# Patient Record
Sex: Female | Born: 1970 | Race: Black or African American | Hispanic: No | Marital: Single | Smoking: Current every day smoker
Health system: Southern US, Community
[De-identification: ages and names within clinical notes are randomized; demographics above are authoritative.]

---

## 2018-01-26 ENCOUNTER — Other Ambulatory Visit: Payer: Self-pay

## 2018-01-26 ENCOUNTER — Encounter (HOSPITAL_BASED_OUTPATIENT_CLINIC_OR_DEPARTMENT_OTHER): Payer: Self-pay | Admitting: *Deleted

## 2018-01-26 ENCOUNTER — Emergency Department (HOSPITAL_BASED_OUTPATIENT_CLINIC_OR_DEPARTMENT_OTHER)
Admission: EM | Admit: 2018-01-26 | Discharge: 2018-01-26 | Disposition: A | Discharge status: Home / Self Care | Payer: No Typology Code available for payment source | Attending: Emergency Medicine | Admitting: Emergency Medicine

## 2018-01-26 DIAGNOSIS — M545 Low back pain, unspecified: Secondary | ICD-10-CM

## 2018-01-26 DIAGNOSIS — M542 Cervicalgia: Secondary | ICD-10-CM | POA: Insufficient documentation

## 2018-01-26 MED ORDER — CYCLOBENZAPRINE HCL 10 MG PO TABS: 10.0000 mg | ORAL_TABLET | Freq: Two times a day (BID) | ORAL | 0 refills | Status: DC | PRN

## 2018-01-26 MED ORDER — IBUPROFEN 800 MG PO TABS
800.0000 mg | ORAL_TABLET | Freq: Once | ORAL | Status: AC
  Administered 2018-01-26: 800 mg via ORAL
  Filled 2018-01-26: qty 1

## 2018-01-26 MED ORDER — CYCLOBENZAPRINE HCL 5 MG PO TABS
5.0000 mg | ORAL_TABLET | Freq: Once | ORAL | Status: AC
  Administered 2018-01-26: 5 mg via ORAL
  Filled 2018-01-26: qty 1

## 2018-01-26 NOTE — Discharge Instructions (Addendum)
Your physical exam checks out today. I have no concerns that you have any broken bones. ° °I have prescribed you a muscle relaxer, Flexeril, for muscle spasms. You may also use Tylenol and/or Ibuprofen (or Naproxen) for pain relief. You may also use warm or cold compresses. Soreness should begin to feel better over the next couple of weeks. Follow-up with your PCP if you continue to have issues for more than 4-6 weeks. ° °
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
## 2020-04-11 ENCOUNTER — Emergency Department (HOSPITAL_BASED_OUTPATIENT_CLINIC_OR_DEPARTMENT_OTHER): Payer: Self-pay

## 2020-04-11 ENCOUNTER — Encounter (HOSPITAL_BASED_OUTPATIENT_CLINIC_OR_DEPARTMENT_OTHER): Payer: Self-pay | Admitting: *Deleted

## 2020-04-11 ENCOUNTER — Other Ambulatory Visit: Payer: Self-pay

## 2020-04-11 ENCOUNTER — Emergency Department (HOSPITAL_BASED_OUTPATIENT_CLINIC_OR_DEPARTMENT_OTHER)
Admission: EM | Admit: 2020-04-11 | Discharge: 2020-04-11 | Disposition: A | Discharge status: Home / Self Care | Payer: Self-pay | Attending: Emergency Medicine | Admitting: Emergency Medicine

## 2020-04-11 DIAGNOSIS — F17290 Nicotine dependence, other tobacco product, uncomplicated: Secondary | ICD-10-CM | POA: Insufficient documentation

## 2020-04-11 DIAGNOSIS — M25522 Pain in left elbow: Secondary | ICD-10-CM | POA: Insufficient documentation

## 2020-04-11 DIAGNOSIS — R030 Elevated blood-pressure reading, without diagnosis of hypertension: Secondary | ICD-10-CM

## 2020-04-11 DIAGNOSIS — M25512 Pain in left shoulder: Secondary | ICD-10-CM | POA: Insufficient documentation

## 2020-04-11 MED ORDER — LIDOCAINE HCL (PF) 1 % IJ SOLN
30.0000 mL | Freq: Once | INTRAMUSCULAR | Status: AC
  Administered 2020-04-11: 10 mL
  Filled 2020-04-11: qty 30

## 2020-04-11 NOTE — Discharge Instructions (Signed)
Follow up regarding your blood pressure Return for any new or worsening symptoms.

## 2020-04-11 NOTE — ED Provider Notes (Signed)
[facility] EMERGENCY DEPARTMENT Provider Note   CSN: [id number] Arrival date & time: 04/11/20  1003     History Chief Complaint  Patient presents with  . Shoulder, neck, back pain    Miranda Jefferson is a 49 y.o. female with past medical history of obesity hypertension who presents emergency department chief complaint of left shoulder pain.  Patient states that the pain is located in between her shoulder blade that has been going on for about 5 days.  She states that it hurts to even lift her arm.  She feels like it radiates down to her elbow.  She denies neck pain, she has tried heat, Biofreeze, Tylenol without much relief of her symptoms.  Pain is worse with movement and lifting the arm.  She feels like sometimes her left shoulder joint "slips out of place."  HPI     History reviewed. No pertinent past medical history.  There are no problems to display for this patient.   Past Surgical History:  Procedure Laterality Date  . CESAREAN SECTION       OB History    Gravida  5   Para  4   Term      Preterm      AB  1   Living        SAB      TAB      Ectopic      Multiple      Live Births              Family History  Problem Relation Age of Onset  . Cancer Mother   . Cancer Father   . Aneurysm Sister     Social History   Tobacco Use  . Smoking status: Current Every Day Smoker    Packs/day: 2.00    Types: Cigars  . Smokeless tobacco: Never Used  Substance Use Topics  . Alcohol use: Yes    Comment: occasionally  . Drug use: Never    Home Medications Prior to Admission medications   Not on File    Allergies    Patient has no known allergies.  Review of Systems   Review of Systems Ten systems reviewed and are negative for acute change, except as noted in the HPI.   Physical Exam Updated Vital Signs BP (!) 170/115 (BP Location: Right Arm)   Pulse 88   Temp 98.3 F (36.8 C) (Oral)   Resp 16   Ht 5\' 6"  (1.676 m)   Wt  88.5 kg   LMP 04/08/2020   SpO2 100%   BMI 31.47 kg/m   Physical Exam Vitals and nursing note reviewed.  Constitutional:      General: She is not in acute distress.    Appearance: She is well-developed. She is not diaphoretic.  HENT:     Head: Normocephalic and atraumatic.  Eyes:     General: No scleral icterus.    Conjunctiva/sclera: Conjunctivae normal.  Cardiovascular:     Rate and Rhythm: Normal rate and regular rhythm.     Heart sounds: Normal heart sounds. No murmur heard.  No friction rub. No gallop.   Pulmonary:     Effort: Pulmonary effort is normal. No respiratory distress.     Breath sounds: Normal breath sounds.  Abdominal:     General: Bowel sounds are normal. There is no distension.     Palpations: Abdomen is soft. There is no mass.     Tenderness: There is no abdominal tenderness.  There is no guarding.  Musculoskeletal:     Cervical back: Normal range of motion.     Comments: Palpable trigger point with reproducible pain and tenderness along the left medial scapular border.  No tenderness to palpation of the left shoulder joint.  No pain with passive range of motion.  Patient reluctant to mobilize  Skin:    General: Skin is warm and dry.  Neurological:     Mental Status: She is alert and oriented to person, place, and time.  Psychiatric:        Behavior: Behavior normal.     ED Results / Procedures / Treatments   Labs (all labs ordered are listed, but only abnormal results are displayed) Labs Reviewed - No data to display  EKG None  Radiology DG Shoulder Left  Result Date: 04/11/2020 CLINICAL DATA:  Left shoulder pain for 5 days EXAM: LEFT SHOULDER - 2+ VIEW COMPARISON:  None. FINDINGS: There is no evidence of fracture or dislocation. There is no evidence of arthropathy or other focal bone abnormality. There is a 3.6 cm linear radiopaque structure which is visualized projecting within the soft tissues at the level of the mid left humeral diaphysis,  which may represent an implanted contraceptive device. IMPRESSION: 1. No acute osseous abnormality. 2. Incidentally noted 3.6 cm linear radiopaque structure which is visualized projecting within the soft tissues at the level of the mid left humeral diaphysis, which may represent an implanted contraceptive device. Electronically Signed   By: Duanne Guess D.O.   On: 04/11/2020 12:00    Procedures Procedures (including critical care time)  Trigger Point Injection Date/Time:04/11/2020 7:00 PM Performed by: Arthor Captain Authorized by: Arthor Captain Consent: Verbal consent obtained. Risks and benefits: risks, benefits and alternatives were discussed Consent given by: patient Patient identity confirmed: provided patient demograhics Preparation: Patient was prepped and draped in the usual sterile fashion. Local anesthesia used: yes Anesthesia: local infiltration Local anesthetic: 1% lidocaine  Anesthetic total: 62ml Patient tolerance: Patient tolerated the procedure well with no immediate complications    Medications Ordered in ED Medications  lidocaine (PF) (XYLOCAINE) 1 % injection 30 mL (10 mLs Infiltration Given 04/11/20 1250)    ED Course  I have reviewed the triage vital signs and the nursing notes.  Pertinent labs & imaging results that were available during my care of the patient were reviewed by me and considered in my medical decision making (see chart for details).    MDM Rules/Calculators/A&P                          Patient's x-ray is negative for acute fracture or dislocation. Patient given trigger point injection with near total resolution of her symptoms.  Discussed outpatient management and follow-up.  She was appropriate for discharge at this time. Final Clinical Impression(s) / ED Diagnoses Final diagnoses:  Trigger point of left shoulder region  Elevated blood pressure reading    Rx / DC Orders ED Discharge Orders    None       Arthor Captain,  PA-C 04/11/20 1902    Little, Ambrose Finland, MD 04/12/20 1515

## 2020-04-11 NOTE — ED Triage Notes (Signed)
Pain to left shoulder, neck and back for 5 days.  Denies injury.

## 2021-06-14 ENCOUNTER — Emergency Department (HOSPITAL_BASED_OUTPATIENT_CLINIC_OR_DEPARTMENT_OTHER)
Admission: EM | Admit: 2021-06-14 | Discharge: 2021-06-14 | Disposition: A | Discharge status: Home / Self Care | Payer: Self-pay | Attending: Emergency Medicine | Admitting: Emergency Medicine

## 2021-06-14 ENCOUNTER — Encounter (HOSPITAL_BASED_OUTPATIENT_CLINIC_OR_DEPARTMENT_OTHER): Payer: Self-pay

## 2021-06-14 DIAGNOSIS — F17210 Nicotine dependence, cigarettes, uncomplicated: Secondary | ICD-10-CM | POA: Insufficient documentation

## 2021-06-14 DIAGNOSIS — I10 Essential (primary) hypertension: Secondary | ICD-10-CM | POA: Insufficient documentation

## 2021-06-14 DIAGNOSIS — R040 Epistaxis: Secondary | ICD-10-CM | POA: Insufficient documentation

## 2021-06-14 MED ORDER — HYDROCHLOROTHIAZIDE 25 MG PO TABS: 25.0000 mg | ORAL_TABLET | Freq: Every day | ORAL | 1 refills | Status: AC

## 2021-06-14 NOTE — ED Provider Notes (Signed)
[facility] EMERGENCY DEPARTMENT Provider Note   CSN: [id number] Arrival date & time: 06/14/21  5366     History Chief Complaint  Patient presents with   Epistaxis    Miranda Jefferson is a 50 y.o. female.  She is here with an on-and-off nosebleed for the last few days.  Both nostrils.  Sometimes forming clots and going down her throat.  She was seen at [facility] few days ago for same.  Found to have elevated blood pressures.  Is working on getting a primary care doctor.  Had a nosebleed again today that has since resolved.  No pain fever cough shortness of breath.  No other bleeding identified.  She does admit to nose picking for the dry debris in her nose.  The history is provided by the patient.  Epistaxis Location:  Bilateral Severity:  Moderate Duration:  4 days Timing:  Intermittent Progression:  Unchanged Chronicity:  New Context: not anticoagulants   Relieved by:  Nothing Worsened by:  Nothing Ineffective treatments:  Applying pressure Associated symptoms: blood in oropharynx   Associated symptoms: no cough, no dizziness, no fever, no headaches, no sore throat and no syncope       History reviewed. No pertinent past medical history.  There are no problems to display for this patient.   Past Surgical History:  Procedure Laterality Date   CESAREAN SECTION       OB History     Gravida  5   Para  4   Term      Preterm      AB  1   Living         SAB      IAB      Ectopic      Multiple      Live Births              Family History  Problem Relation Age of Onset   Cancer Mother    Cancer Father    Aneurysm Sister     Social History   Tobacco Use   Smoking status: Every Day    Types: Cigars   Smokeless tobacco: Never  Substance Use Topics   Alcohol use: Not Currently    Comment: occasionally   Drug use: Never    Home Medications Prior to Admission medications   Not on File    Allergies    Patient has  no known allergies.  Review of Systems   Review of Systems  Constitutional:  Negative for fever.  HENT:  Positive for nosebleeds. Negative for sore throat.   Eyes:  Negative for visual disturbance.  Respiratory:  Negative for cough.   Cardiovascular:  Negative for chest pain and syncope.  Gastrointestinal:  Negative for abdominal pain.  Neurological:  Negative for dizziness and headaches.   Physical Exam Updated Vital Signs BP (!) 163/108    Pulse 76    Temp 98.3 F (36.8 C)    Resp 16    LMP 06/11/2021 (Approximate)    SpO2 97%   Physical Exam Constitutional:      Appearance: Normal appearance. She is well-developed.  HENT:     Head: Normocephalic and atraumatic.     Nose: Nose normal. No congestion or rhinorrhea.     Mouth/Throat:     Mouth: Mucous membranes are moist.     Pharynx: Oropharynx is clear.  Eyes:     Conjunctiva/sclera: Conjunctivae normal.  Cardiovascular:     Rate and  Rhythm: Normal rate and regular rhythm.  Pulmonary:     Effort: Pulmonary effort is normal.     Breath sounds: Normal breath sounds.  Musculoskeletal:     Cervical back: Neck supple.  Skin:    General: Skin is warm and dry.  Neurological:     General: No focal deficit present.     Mental Status: She is alert.     GCS: GCS eye subscore is 4. GCS verbal subscore is 5. GCS motor subscore is 6.    ED Results / Procedures / Treatments   Labs (all labs ordered are listed, but only abnormal results are displayed) Labs Reviewed - No data to display  EKG None  Radiology No results found.  Procedures Procedures   Medications Ordered in ED Medications - No data to display  ED Course  I have reviewed the triage vital signs and the nursing notes.  Pertinent labs & imaging results that were available during my care of the patient were reviewed by me and considered in my medical decision making (see chart for details).    MDM Rules/Calculators/A&P                         50 year old  complaint of epistaxis.  Not on blood thinners.  No bleeding currently.  No clear source to cauterize.  Patient also concerned about her elevated blood pressure.  She said she has a history of same about not on medication.  We will start her on some blood pressure medication.  No evidence of endorgan damage otherwise.  Recommended humidity and avoid picking her nose.  Return instructions discussed    Final Clinical Impression(s) / ED Diagnoses Final diagnoses:  Epistaxis  Primary hypertension    Rx / DC Orders ED Discharge Orders          Ordered    hydrochlorothiazide (HYDRODIURIL) 25 MG tablet  Daily        06/14/21 0819             Terrilee Files, MD 06/14/21 [phone]

## 2021-06-14 NOTE — ED Triage Notes (Signed)
Pt reports nose bleeding this morning due to hypertension. She went to hospital Tuesday for same complaint

## 2021-06-14 NOTE — Discharge Instructions (Signed)
You were seen in the emergency department for elevated blood pressure and nosebleeds.  There was no active nose bleeding and so no packing was placed.  Your blood pressure was elevated and this will need to be followed up with your primary care doctor.  We are starting you on some blood pressure medication.  You should also limit your salt intake.  You should try to use some humidity in your room.  Return if any worsening or concerning symptoms

## 2021-06-20 IMAGING — DX DG SHOULDER 2+V*L*
3 series · 3 of 3 positions shown · non-contrast
Comparison: None.

CLINICAL DATA: Left shoulder pain for 5 days

EXAM:
LEFT SHOULDER - 2+ VIEW

[shoulder grashey]
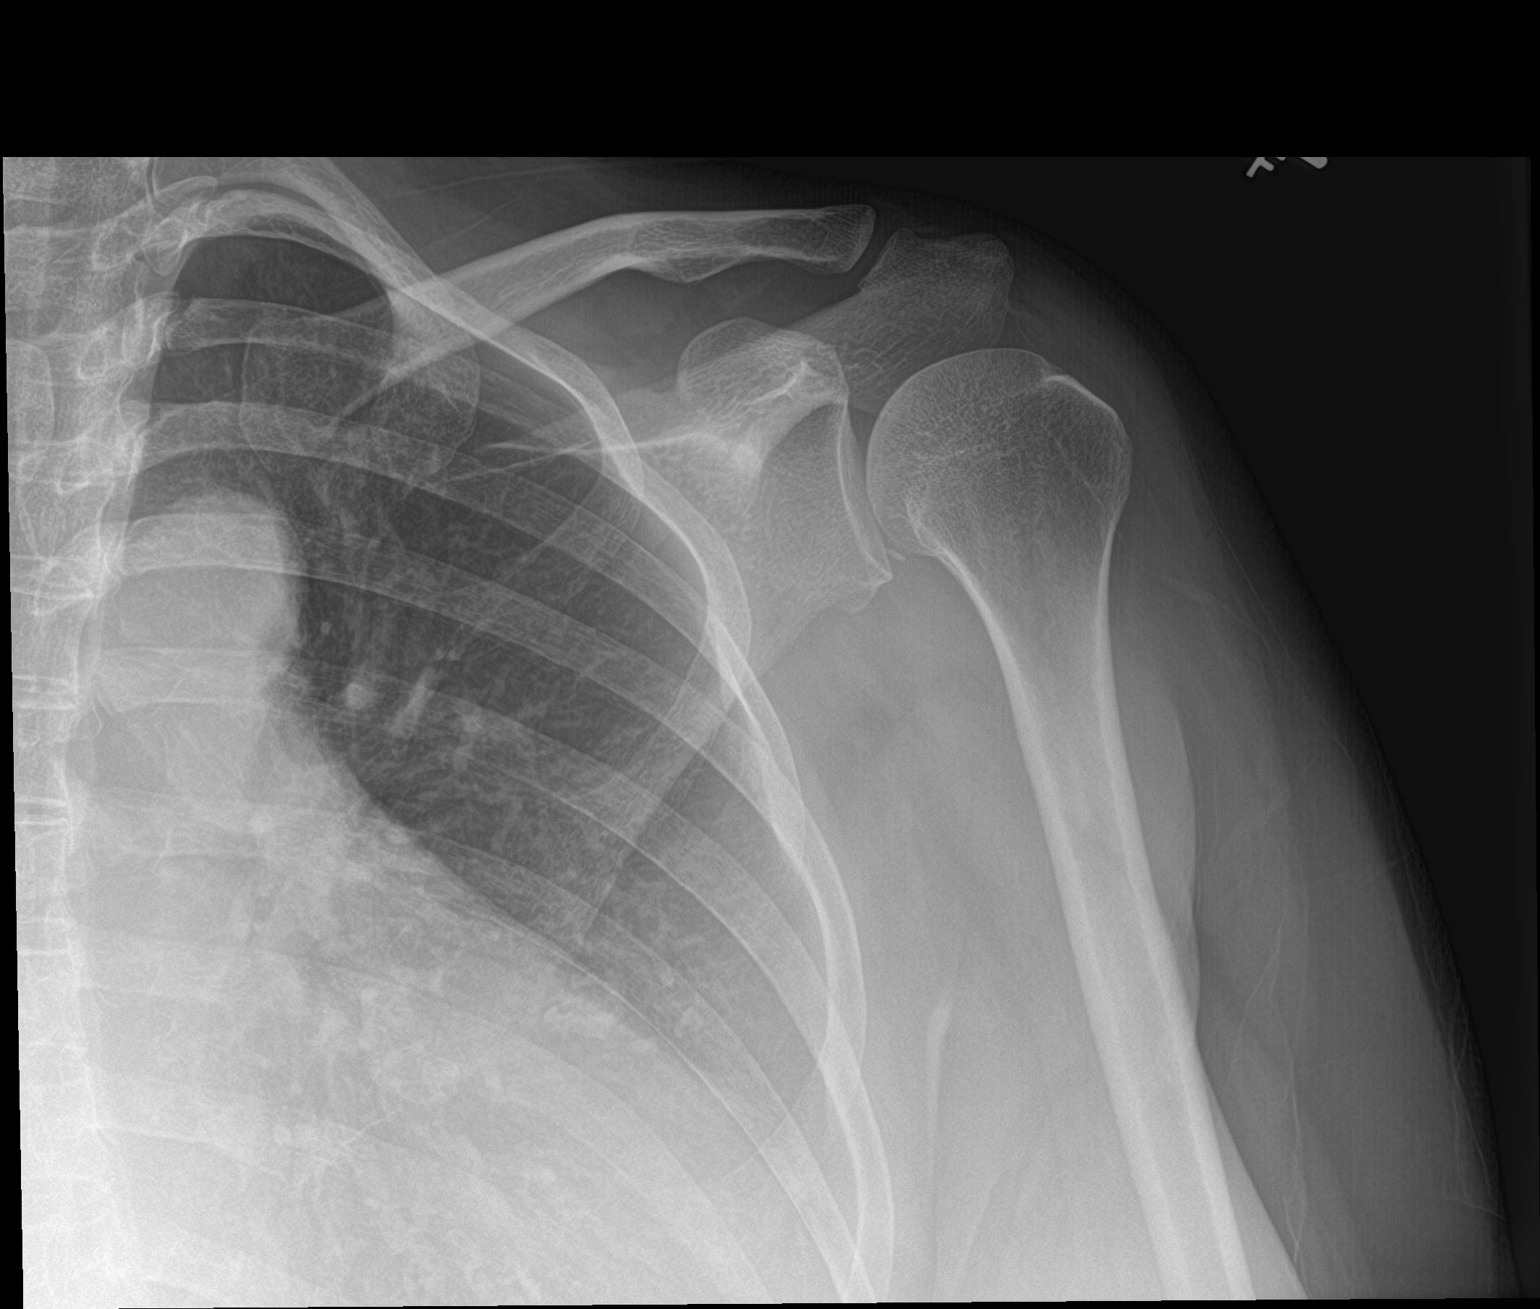

[shoulder y view]
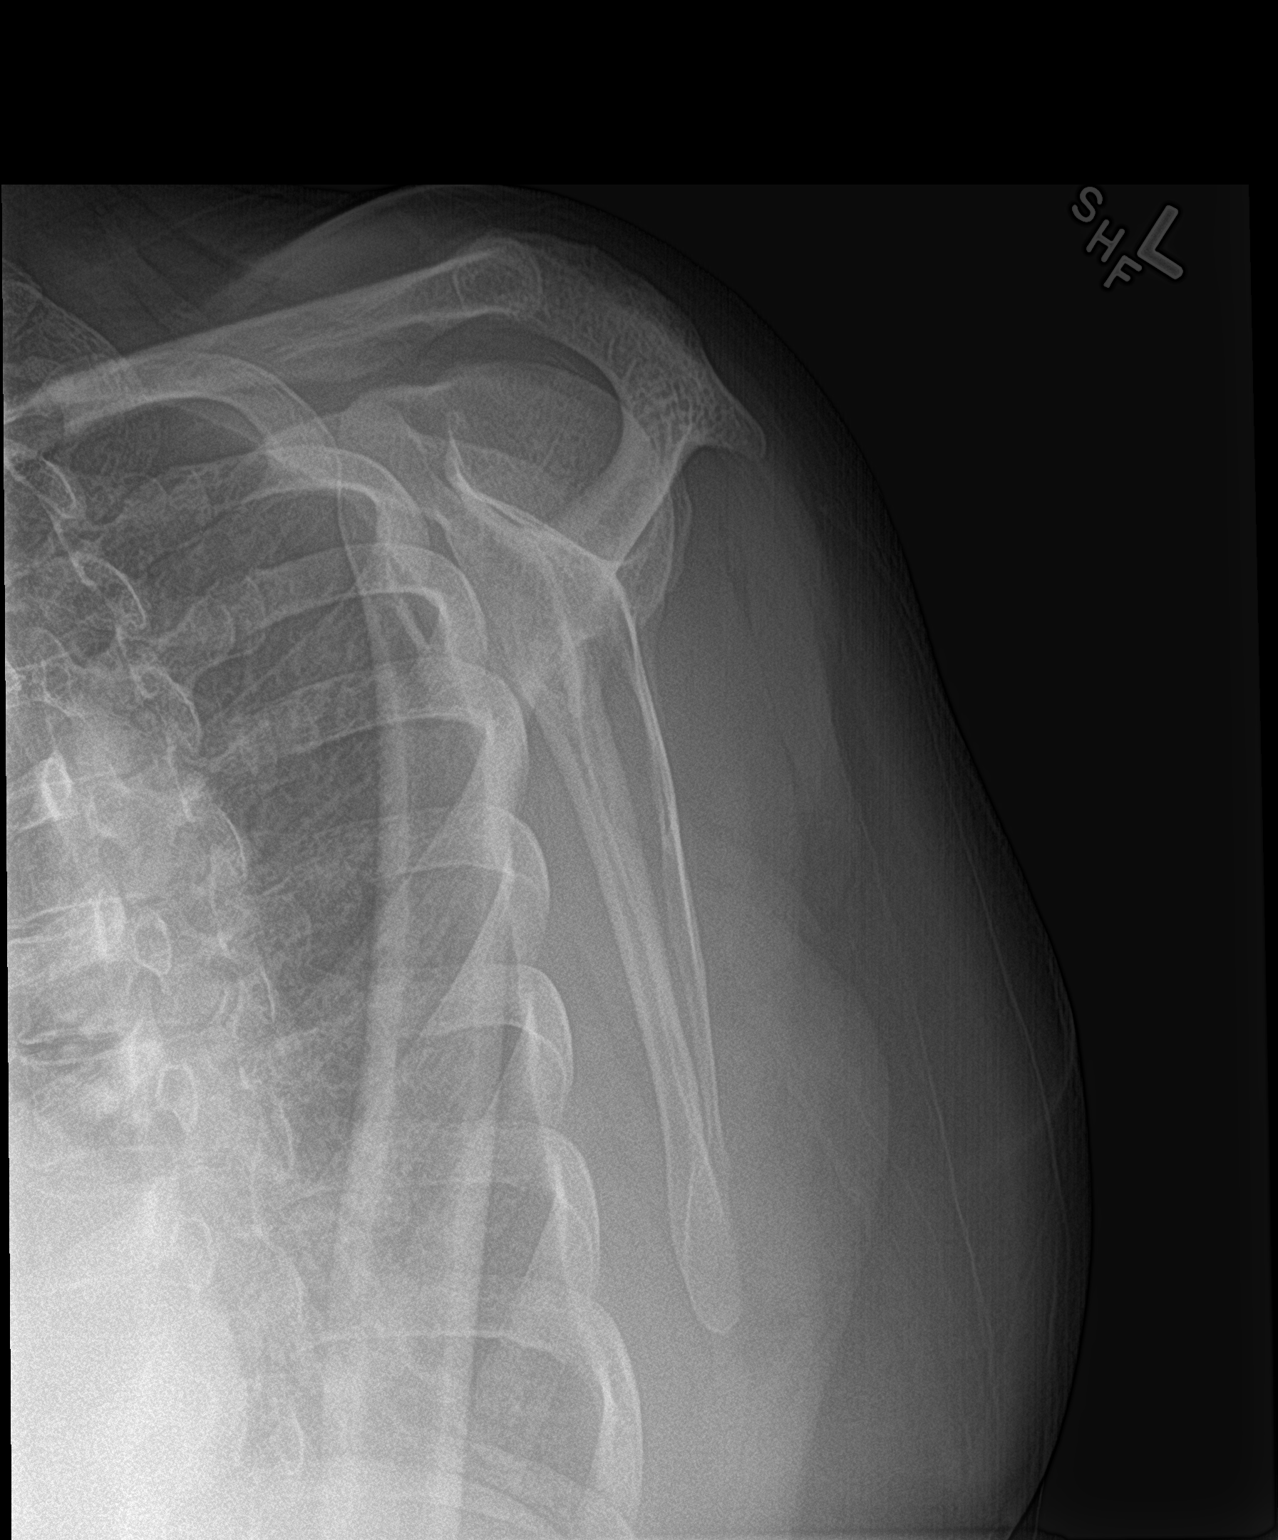

[shoulder axillary]
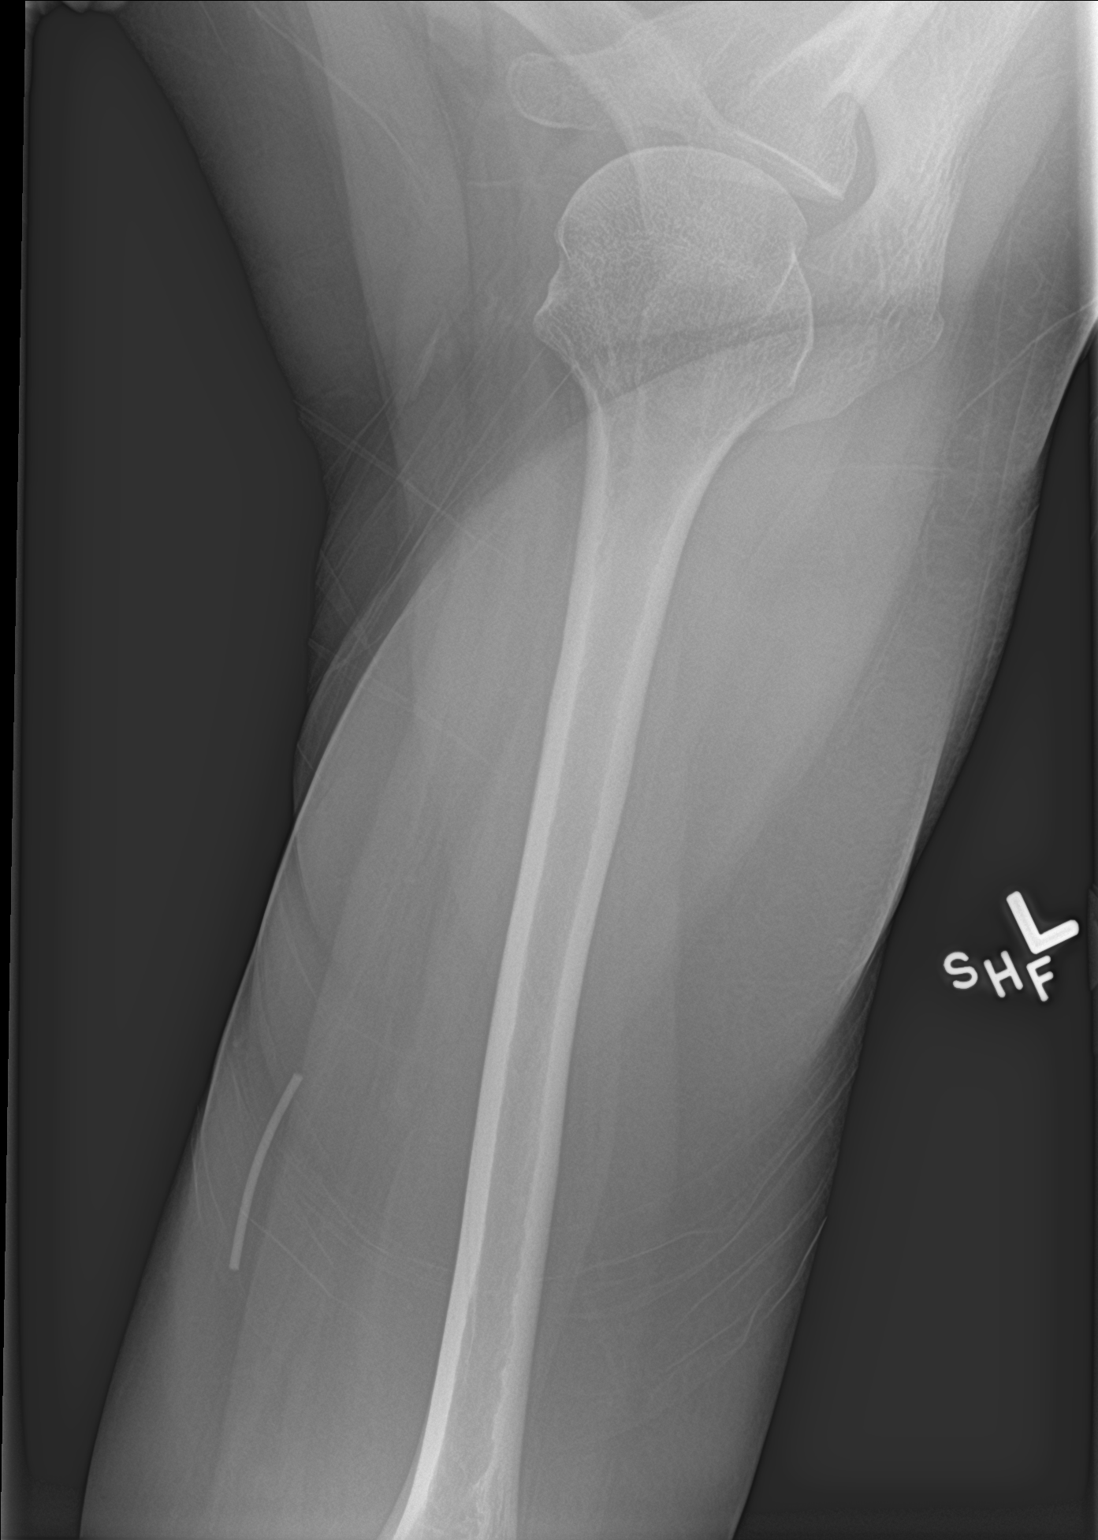

[3 of 3 positions shown; findings below may reference images not displayed]

FINDINGS: There is no evidence of fracture or dislocation. There is no
evidence of arthropathy or other focal bone abnormality. There is a
3.6 cm linear radiopaque structure which is visualized projecting
within the soft tissues at the level of the mid left humeral
diaphysis, which may represent an implanted contraceptive device.
IMPRESSION: 1. No acute osseous abnormality.
2. Incidentally noted 3.6 cm linear radiopaque structure which is
visualized projecting within the soft tissues at the level of the
mid left humeral diaphysis, which may represent an implanted
contraceptive device.
